# Patient Record
Sex: Female | Born: 2000 | Race: Black or African American | Hispanic: No | Marital: Single | Smoking: Never smoker
Health system: Southern US, Community
[De-identification: ages and names within clinical notes are randomized; demographics above are authoritative.]

---

## 2021-08-09 ENCOUNTER — Emergency Department (HOSPITAL_COMMUNITY): Payer: Managed Care, Other (non HMO)

## 2021-08-09 ENCOUNTER — Encounter (HOSPITAL_COMMUNITY): Payer: Self-pay | Admitting: Emergency Medicine

## 2021-08-09 ENCOUNTER — Emergency Department (HOSPITAL_COMMUNITY)
Admission: EM | Admit: 2021-08-09 | Discharge: 2021-08-09 | Disposition: A | Discharge status: Home / Self Care | Payer: Managed Care, Other (non HMO) | Attending: Emergency Medicine | Admitting: Emergency Medicine

## 2021-08-09 ENCOUNTER — Other Ambulatory Visit: Payer: Self-pay

## 2021-08-09 DIAGNOSIS — R109 Unspecified abdominal pain: Secondary | ICD-10-CM | POA: Diagnosis not present

## 2021-08-09 DIAGNOSIS — R112 Nausea with vomiting, unspecified: Secondary | ICD-10-CM

## 2021-08-09 DIAGNOSIS — E860 Dehydration: Secondary | ICD-10-CM | POA: Diagnosis not present

## 2021-08-09 LAB — CBC WITH DIFFERENTIAL/PLATELET
Abs Immature Granulocytes: 0.1 10*3/uL — ABNORMAL HIGH (ref 0.00–0.07)
Basophils Absolute: 0 10*3/uL (ref 0.0–0.1)
Basophils Relative: 0 %
Eosinophils Absolute: 0 10*3/uL (ref 0.0–0.5)
Eosinophils Relative: 0 %
HCT: 46.2 % — ABNORMAL HIGH (ref 36.0–46.0)
Hemoglobin: 15.3 g/dL — ABNORMAL HIGH (ref 12.0–15.0)
Immature Granulocytes: 1 %
Lymphocytes Relative: 5 %
Lymphs Abs: 0.7 10*3/uL (ref 0.7–4.0)
MCH: 28.7 pg (ref 26.0–34.0)
MCHC: 33.1 g/dL (ref 30.0–36.0)
MCV: 86.7 fL (ref 80.0–100.0)
Monocytes Absolute: 0.7 10*3/uL (ref 0.1–1.0)
Monocytes Relative: 5 %
Neutro Abs: 14.3 10*3/uL — ABNORMAL HIGH (ref 1.7–7.7)
Neutrophils Relative %: 89 %
Platelets: 268 10*3/uL (ref 150–400)
RBC: 5.33 MIL/uL — ABNORMAL HIGH (ref 3.87–5.11)
RDW: 13.7 % (ref 11.5–15.5)
WBC: 15.9 10*3/uL — ABNORMAL HIGH (ref 4.0–10.5)
nRBC: 0 % (ref 0.0–0.2)

## 2021-08-09 LAB — COMPREHENSIVE METABOLIC PANEL WITH GFR
ALT: 19 U/L (ref 0–44)
AST: 22 U/L (ref 15–41)
Albumin: 4.9 g/dL (ref 3.5–5.0)
Alkaline Phosphatase: 66 U/L (ref 38–126)
Anion gap: 11 (ref 5–15)
BUN: 14 mg/dL (ref 6–20)
CO2: 20 mmol/L — ABNORMAL LOW (ref 22–32)
Calcium: 10.3 mg/dL (ref 8.9–10.3)
Chloride: 105 mmol/L (ref 98–111)
Creatinine, Ser: 0.82 mg/dL (ref 0.44–1.00)
GFR, Estimated: >60 mL/min
Glucose, Bld: 150 mg/dL — ABNORMAL HIGH (ref 70–99)
Potassium: 3.8 mmol/L (ref 3.5–5.1)
Sodium: 136 mmol/L (ref 135–145)
Total Bilirubin: 1 mg/dL (ref 0.3–1.2)
Total Protein: 8.7 g/dL — ABNORMAL HIGH (ref 6.5–8.1)

## 2021-08-09 LAB — URINALYSIS, ROUTINE W REFLEX MICROSCOPIC
Bilirubin Urine: NEGATIVE
Glucose, UA: NEGATIVE mg/dL
Hgb urine dipstick: NEGATIVE
Ketones, ur: NEGATIVE mg/dL
Leukocytes,Ua: NEGATIVE
Nitrite: NEGATIVE
Protein, ur: NEGATIVE mg/dL
Specific Gravity, Urine: <1.005 — ABNORMAL LOW (ref 1.005–1.030)
pH: 5 (ref 5.0–8.0)

## 2021-08-09 LAB — RAPID URINE DRUG SCREEN, HOSP PERFORMED
Amphetamines: NOT DETECTED
Barbiturates: NOT DETECTED
Benzodiazepines: NOT DETECTED
Cocaine: NOT DETECTED
Opiates: POSITIVE — AB
Tetrahydrocannabinol: POSITIVE — AB

## 2021-08-09 LAB — I-STAT BETA HCG BLOOD, ED (MC, WL, AP ONLY): I-stat hCG, quantitative: <5 m[IU]/mL (ref <5)

## 2021-08-09 LAB — LIPASE, BLOOD: Lipase: 24 U/L (ref 11–51)

## 2021-08-09 LAB — ETHANOL: Alcohol, Ethyl (B): <10 mg/dL

## 2021-08-09 MED ORDER — ONDANSETRON 4 MG PO TBDP: 4.0000 mg | ORAL_TABLET | Freq: Three times a day (TID) | ORAL | 0 refills | Status: DC | PRN

## 2021-08-09 MED ORDER — SODIUM CHLORIDE 0.9 % IV BOLUS
1000.0000 mL | Freq: Once | INTRAVENOUS | Status: AC
  Administered 2021-08-09: 1000 mL via INTRAVENOUS

## 2021-08-09 MED ORDER — IOHEXOL 300 MG/ML  SOLN
100.0000 mL | Freq: Once | INTRAMUSCULAR | Status: AC | PRN
  Administered 2021-08-09: 100 mL via INTRAVENOUS

## 2021-08-09 MED ORDER — ONDANSETRON HCL 4 MG/2ML IJ SOLN
4.0000 mg | Freq: Once | INTRAMUSCULAR | Status: AC
  Administered 2021-08-09: 4 mg via INTRAVENOUS
  Filled 2021-08-09: qty 2

## 2021-08-09 MED ORDER — MORPHINE SULFATE (PF) 4 MG/ML IV SOLN
4.0000 mg | Freq: Once | INTRAVENOUS | Status: AC
Start: 2021-08-09 — End: 2021-08-09
  Administered 2021-08-09: 4 mg via INTRAVENOUS
  Filled 2021-08-09: qty 1

## 2021-08-09 NOTE — ED Triage Notes (Signed)
Patient states she had 3 shots last night, ate a whole bag of candy and 2 full meals but has been vomiting sine 9am this morning. Reports diarrhea.  ?

## 2021-08-09 NOTE — Discharge Instructions (Signed)
Your history, exam, work-up today reflected dehydration likely due to either a viral gastroenteritis versus gastric irritation from food or drink.  As you have had stability over the last 5 hours and your symptoms have improved with medications, we feel you are safe for discharge home.  Please rest and stay hydrated and follow-up with your primary doctor.  The CT scan did not show any evidence of appendicitis, bowel obstruction, or other concerning finding.  It did show the corpus luteum in the right ovary as we discussed likely related to ovulation.  Please follow-up with your OB/GYN.  We had a discussion together and agreed to hold on further exam and ultrasound given your lack of other pelvic symptoms.  If any symptoms change or worsen acutely, please return to the nearest emergency department. ?

## 2021-08-09 NOTE — ED Provider Notes (Signed)
She is [facility]-EMERGENCY DEPT Provider Note   CSN: [id number] Arrival date & time: 08/09/21  1647     History  Chief Complaint  Patient presents with   Emesis    Cynthia Mccall is a 21 y.o. female.   Emesis Severity:  Severe Duration:  8 hours Timing:  Constant Quality:  Stomach contents Progression:  Unchanged Chronicity:  New Recent urination:  Normal Relieved by:  Nothing Worsened by:  Nothing Associated symptoms: abdominal pain and diarrhea   Associated symptoms: no chills, no cough, no fever, no headaches, no myalgias and no URI   Risk factors: alcohol use   Risk factors: no diabetes       Home Medications Prior to Admission medications   Not on File      Allergies    Patient has no known allergies.    Review of Systems   Review of Systems  Constitutional:  Negative for chills, diaphoresis, fatigue and fever.  HENT:  Negative for congestion.   Respiratory:  Negative for cough, chest tightness, shortness of breath and wheezing.   Cardiovascular:  Negative for chest pain, palpitations and leg swelling.  Gastrointestinal:  Positive for abdominal pain, diarrhea, nausea and vomiting. Negative for abdominal distention and constipation.  Genitourinary:  Negative for dysuria, flank pain, frequency, pelvic pain, vaginal bleeding, vaginal discharge and vaginal pain.  Musculoskeletal:  Negative for back pain, myalgias, neck pain and neck stiffness.  Skin:  Negative for rash and wound.  Neurological:  Negative for weakness, light-headedness, numbness and headaches.  Psychiatric/Behavioral:  Negative for agitation and confusion.   All other systems reviewed and are negative.  Physical Exam Updated Vital Signs BP 111/79 (BP Location: Left Arm)    Pulse 88    Temp 98.7 F (37.1 C) (Oral)    Resp 18    Ht 5\' 9"  (1.753 m)    Wt 68 kg    LMP  (LMP Unknown)    SpO2 99%    BMI 22.15 kg/m  Physical Exam Vitals and nursing note reviewed.   Constitutional:      General: She is not in acute distress.    Appearance: She is well-developed. She is not ill-appearing, toxic-appearing or diaphoretic.  HENT:     Head: Normocephalic and atraumatic.     Nose: No congestion or rhinorrhea.     Mouth/Throat:     Mouth: Mucous membranes are dry.     Pharynx: No oropharyngeal exudate or posterior oropharyngeal erythema.  Eyes:     Extraocular Movements: Extraocular movements intact.     Conjunctiva/sclera: Conjunctivae normal.     Pupils: Pupils are equal, round, and reactive to light.  Cardiovascular:     Rate and Rhythm: Normal rate and regular rhythm.     Heart sounds: No murmur heard. Pulmonary:     Effort: Pulmonary effort is normal. No respiratory distress.     Breath sounds: Normal breath sounds. No wheezing, rhonchi or rales.  Chest:     Chest wall: No tenderness.  Abdominal:     General: Abdomen is flat.     Palpations: Abdomen is soft.     Tenderness: There is abdominal tenderness. There is no right CVA tenderness, left CVA tenderness, guarding or rebound.  Musculoskeletal:        General: No swelling or tenderness.     Cervical back: Neck supple. No tenderness.     Right lower leg: No edema.     Left lower leg: No edema.  Skin:    General: Skin is warm and dry.     Capillary Refill: Capillary refill takes less than 2 seconds.     Findings: No erythema or rash.  Neurological:     General: No focal deficit present.     Mental Status: She is alert.  Psychiatric:        Mood and Affect: Mood normal.    ED Results / Procedures / Treatments   Labs (all labs ordered are listed, but only abnormal results are displayed) Labs Reviewed  CBC WITH DIFFERENTIAL/PLATELET - Abnormal; Notable for the following components:      Result Value   WBC 15.9 (*)    RBC 5.33 (*)    Hemoglobin 15.3 (*)    HCT 46.2 (*)    Neutro Abs 14.3 (*)    Abs Immature Granulocytes 0.10 (*)    All other components within normal limits   COMPREHENSIVE METABOLIC PANEL - Abnormal; Notable for the following components:   CO2 20 (*)    Glucose, Bld 150 (*)    Total Protein 8.7 (*)    All other components within normal limits  URINALYSIS, ROUTINE W REFLEX MICROSCOPIC - Abnormal; Notable for the following components:   Specific Gravity, Urine <1.005 (*)    All other components within normal limits  RAPID URINE DRUG SCREEN, HOSP PERFORMED - Abnormal; Notable for the following components:   Opiates POSITIVE (*)    Tetrahydrocannabinol POSITIVE (*)    All other components within normal limits  URINE CULTURE  LIPASE, BLOOD  ETHANOL  I-STAT BETA HCG BLOOD, ED ([facility], [facility], [facility] ONLY)    EKG None  Radiology CT ABDOMEN PELVIS W CONTRAST  Result Date: 08/09/2021 CLINICAL DATA:  Abdominal pain, acute, nonlocalized EXAM: CT ABDOMEN AND PELVIS WITH CONTRAST TECHNIQUE: Multidetector CT imaging of the abdomen and pelvis was performed using the standard protocol following bolus administration of intravenous contrast. RADIATION DOSE REDUCTION: This exam was performed according to the departmental dose-optimization program which includes automated exposure control, adjustment of the mA and/or kV according to patient size and/or use of iterative reconstruction technique. CONTRAST:  100mL OMNIPAQUE IOHEXOL 300 MG/ML  SOLN COMPARISON:  None. FINDINGS: Lower chest: No acute abnormality. Hepatobiliary: No focal liver abnormality is seen. No gallstones, gallbladder wall thickening, or biliary dilatation. Pancreas: Unremarkable. No pancreatic ductal dilatation or surrounding inflammatory changes. Spleen: Normal in size without focal abnormality. Adrenals/Urinary Tract: Adrenal glands are unremarkable. Kidneys enhance symmetrically. No hydronephrosis. No obstructing nephrolithiasis. Bladder is decompressed. Stomach/Bowel: Stomach is within normal limits. Appendix appears normal. No evidence of bowel wall thickening, distention, or inflammatory changes.  Vascular/Lymphatic: No significant vascular findings are present. No enlarged abdominal or pelvic lymph nodes. Reproductive: Uterus and bilateral adnexa are unremarkable. There is a RIGHT-sided corpus luteum. Other: Trace pelvic free fluid, physiologic. Musculoskeletal: No acute or significant osseous findings. IMPRESSION: No suspicious CT etiology for acute abdominal pain identified. RIGHT-sided corpus luteum which could reflect a source of pain appropriate clinical setting. Electronically Signed   By: Meda KlinefelterStephanie  Peacock M.D.   On: 08/09/2021 18:43    Procedures Procedures    Medications Ordered in ED Medications  sodium chloride 0.9 % bolus 1,000 mL (0 mLs Intravenous Stopped 08/09/21 1832)  ondansetron (ZOFRAN) injection 4 mg (4 mg Intravenous Given 08/09/21 1723)  morphine (PF) 4 MG/ML injection 4 mg (4 mg Intravenous Given 08/09/21 1741)  iohexol (OMNIPAQUE) 300 MG/ML solution 100 mL (100 mLs Intravenous Contrast Given 08/09/21 1824)  sodium chloride 0.9 % bolus  1,000 mL (0 mLs Intravenous Stopped 08/09/21 2124)    ED Course/ Medical Decision Making/ A&P                           Medical Decision Making Amount and/or Complexity of Data Reviewed Labs: ordered. Radiology: ordered.  Risk Prescription drug management.    Cynthia Mccall is a 21 y.o. female with no significant past medical history who presents with nausea, vomiting, diarrhea, abdominal pain, and fatigue.  According to patient and friend, patient was doing well until this morning.  Patient reportedly does not drink alcohol and had 3 shots of vodka last night as well as eating [employer] and cookout.  The friend says that she had similar nausea, vomiting, and diarrhea today but not as severe as the patient.  Patient says that she is having abdominal cramping and aching and has not been able to control her vomiting.  She denies any constipation and denies any urinary symptoms.  Denies any vaginal bleeding or vaginal discharge.  Reports  she has not missed any menstrual cycle.  She denies any fevers, chills, congestion, cough, chest pain, shortness of breath.  Patient was agitated with all the vomiting.  There was no report of any bloody emesis or stools.  No reported drug use.  No other complaints reported  On exam, patient is rolling around the bed agitated with nausea and vomiting.  She reportedly had diarrhea into a trash can in triage.  On my exam, patient was complaining of discomfort wherever I was pushing on her abdomen but no focality initially.  Lungs were clear and chest was nontender.  Patient had dry mucous membranes and was dry heaving.  Clinically I suspect she has either food poisoning, symptoms from alcohol use and dehydration, or a gastroenteritis given the friend having similar symptoms.  We will get screening labs, give her fluids, nausea medicine, and will reassess.  If she is having any focality to her abdominal pain, we will discuss further work-up including possible imaging however initially I suspect this is more dehydration.  Anticipate reassessment after work-up.     5:38 PM Patient still writhing around screaming in pain.  We will give some pain medicine and get a CT scan given her continued tenderness now all of the abdomen.  7:55 PM CT scan returned showing no acute abnormality to cause symptoms.  Patient does have corpus luteum on the right which may explain some of discomfort.  I reassessed patient and her pain is improved.  We discussed pelvic exam and ultrasound but patient reports the pain has slightly improved and she does not think she has a pelvic problem at this time.  Also tachycardic so we will give more fluids as she is also dehydrated.  We will also start a p.o. challenge.  We will wait for her urinalysis to return but if work-up is reassuring and she is feeling better, dissipate discharge with nausea medicine for likely either food or alcohol causing gastritis with severe nausea, vomiting, and  abdominal pain.  After fluids and nausea medicine she is feeling much better.  Her labs also returned overall reassuring.  No evidence of UTI.  Patient will follow-up with PCP and is feeling better now.  She was given prescription for nausea medicine.  I suspect either a viral gastroenteritis versus symptoms related to food or drink intake.  Patient is in agreement with discharge home and PCP follow-up.  She will also follow-up with  OB/GYN for the corpus luteum seen on the right side.  She was not having further lower abdominal tenderness and we agreed for holding on further work-up there.  She no questions or concerns and was discharged in good condition.        Final Clinical Impression(s) / ED Diagnoses Final diagnoses:  Dehydration  Nausea vomiting and diarrhea    Rx / DC Orders ED Discharge Orders          Ordered    ondansetron (ZOFRAN-ODT) 4 MG disintegrating tablet  Every 8 hours PRN        08/09/21 2154            Clinical Impression: 1. Dehydration   2. Nausea vomiting and diarrhea     Disposition: Discharge  Condition: Good  I have discussed the results, Dx and Tx plan with the pt(& family if present). He/she/they expressed understanding and agree(s) with the plan. Discharge instructions discussed at great length. Strict return precautions discussed and pt &/or family have verbalized understanding of the instructions. No further questions at time of discharge.    New Prescriptions   ONDANSETRON (ZOFRAN-ODT) 4 MG DISINTEGRATING TABLET    Take 1 tablet (4 mg total) by mouth every 8 (eight) hours as needed for nausea or vomiting.    Follow Up: [facility] [address] [place] [address] [phone] Schedule an appointment as soon as possible for a visit    [facility]ENC[facility] DE[facility] [address] [id number] mc [place] [address] [phone]         Koven Belinsky, Canary Brim, MD 08/09/21 2202

## 2021-08-09 NOTE — ED Notes (Signed)
Went into triage room to take pt to room 25, Pt stated she had diarrhea in the trash can.   ?

## 2021-08-11 LAB — URINE CULTURE: Culture: <10000 — AB

## 2022-07-29 ENCOUNTER — Emergency Department (HOSPITAL_COMMUNITY)
Admission: EM | Admit: 2022-07-29 | Discharge: 2022-07-29 | Disposition: A | Discharge status: Home / Self Care | Payer: Managed Care, Other (non HMO) | Attending: Emergency Medicine | Admitting: Emergency Medicine

## 2022-07-29 ENCOUNTER — Encounter (HOSPITAL_COMMUNITY): Payer: Self-pay | Admitting: Emergency Medicine

## 2022-07-29 ENCOUNTER — Other Ambulatory Visit: Payer: Self-pay

## 2022-07-29 ENCOUNTER — Emergency Department (HOSPITAL_COMMUNITY): Payer: Managed Care, Other (non HMO)

## 2022-07-29 DIAGNOSIS — M791 Myalgia, unspecified site: Secondary | ICD-10-CM | POA: Diagnosis not present

## 2022-07-29 DIAGNOSIS — D72829 Elevated white blood cell count, unspecified: Secondary | ICD-10-CM | POA: Diagnosis not present

## 2022-07-29 DIAGNOSIS — R1084 Generalized abdominal pain: Secondary | ICD-10-CM | POA: Insufficient documentation

## 2022-07-29 DIAGNOSIS — R112 Nausea with vomiting, unspecified: Secondary | ICD-10-CM | POA: Diagnosis not present

## 2022-07-29 DIAGNOSIS — R197 Diarrhea, unspecified: Secondary | ICD-10-CM | POA: Insufficient documentation

## 2022-07-29 DIAGNOSIS — Z1152 Encounter for screening for COVID-19: Secondary | ICD-10-CM | POA: Insufficient documentation

## 2022-07-29 LAB — CBC WITH DIFFERENTIAL/PLATELET
Abs Immature Granulocytes: 0.05 10*3/uL (ref 0.00–0.07)
Basophils Absolute: 0 10*3/uL (ref 0.0–0.1)
Basophils Relative: 0 %
Eosinophils Absolute: 0 10*3/uL (ref 0.0–0.5)
Eosinophils Relative: 0 %
HCT: 43.3 % (ref 36.0–46.0)
Hemoglobin: 14.2 g/dL (ref 12.0–15.0)
Immature Granulocytes: 0 %
Lymphocytes Relative: 4 %
Lymphs Abs: 0.5 10*3/uL — ABNORMAL LOW (ref 0.7–4.0)
MCH: 28.5 pg (ref 26.0–34.0)
MCHC: 32.8 g/dL (ref 30.0–36.0)
MCV: 86.8 fL (ref 80.0–100.0)
Monocytes Absolute: 0.4 10*3/uL (ref 0.1–1.0)
Monocytes Relative: 4 %
Neutro Abs: 11.7 10*3/uL — ABNORMAL HIGH (ref 1.7–7.7)
Neutrophils Relative %: 92 %
Platelets: 213 10*3/uL (ref 150–400)
RBC: 4.99 MIL/uL (ref 3.87–5.11)
RDW: 14.3 % (ref 11.5–15.5)
WBC: 12.7 10*3/uL — ABNORMAL HIGH (ref 4.0–10.5)
nRBC: 0 % (ref 0.0–0.2)

## 2022-07-29 LAB — COMPREHENSIVE METABOLIC PANEL
ALT: 20 U/L (ref 0–44)
AST: 26 U/L (ref 15–41)
Albumin: 4.6 g/dL (ref 3.5–5.0)
Alkaline Phosphatase: 53 U/L (ref 38–126)
Anion gap: 13 (ref 5–15)
BUN: 18 mg/dL (ref 6–20)
CO2: 20 mmol/L — ABNORMAL LOW (ref 22–32)
Calcium: 9.5 mg/dL (ref 8.9–10.3)
Chloride: 106 mmol/L (ref 98–111)
Creatinine, Ser: 0.83 mg/dL (ref 0.44–1.00)
GFR, Estimated: >60 mL/min (ref >60)
Glucose, Bld: 134 mg/dL — ABNORMAL HIGH (ref 70–99)
Potassium: 3.5 mmol/L (ref 3.5–5.1)
Sodium: 139 mmol/L (ref 135–145)
Total Bilirubin: 0.9 mg/dL (ref 0.3–1.2)
Total Protein: 7.8 g/dL (ref 6.5–8.1)

## 2022-07-29 LAB — ETHANOL: Alcohol, Ethyl (B): <10 mg/dL (ref <10)

## 2022-07-29 LAB — RESP PANEL BY RT-PCR (RSV, FLU A&B, COVID)  RVPGX2
Influenza A by PCR: NEGATIVE
Influenza B by PCR: NEGATIVE
Resp Syncytial Virus by PCR: NEGATIVE
SARS Coronavirus 2 by RT PCR: NEGATIVE

## 2022-07-29 LAB — LIPASE, BLOOD: Lipase: 33 U/L (ref 11–51)

## 2022-07-29 LAB — HCG, QUANTITATIVE, PREGNANCY: hCG, Beta Chain, Quant, S: <1 m[IU]/mL (ref <5)

## 2022-07-29 MED ORDER — IOHEXOL 300 MG/ML  SOLN
100.0000 mL | Freq: Once | INTRAMUSCULAR | Status: AC | PRN
  Administered 2022-07-29: 100 mL via INTRAVENOUS

## 2022-07-29 MED ORDER — DROPERIDOL 2.5 MG/ML IJ SOLN
1.2500 mg | Freq: Once | INTRAMUSCULAR | Status: AC
  Administered 2022-07-29: 1.25 mg via INTRAVENOUS
  Filled 2022-07-29: qty 2

## 2022-07-29 MED ORDER — ONDANSETRON HCL 4 MG PO TABS: 4.0000 mg | ORAL_TABLET | Freq: Three times a day (TID) | ORAL | 0 refills | Status: AC | PRN

## 2022-07-29 MED ORDER — DICYCLOMINE HCL 20 MG PO TABS: 20.0000 mg | ORAL_TABLET | Freq: Two times a day (BID) | ORAL | 0 refills | Status: AC | PRN

## 2022-07-29 MED ORDER — LACTATED RINGERS IV BOLUS
1000.0000 mL | Freq: Once | INTRAVENOUS | Status: AC
  Administered 2022-07-29: 1000 mL via INTRAVENOUS

## 2022-07-29 MED ORDER — KETOROLAC TROMETHAMINE 30 MG/ML IJ SOLN
30.0000 mg | Freq: Once | INTRAMUSCULAR | Status: AC
Start: 2022-07-29 — End: 2022-07-29
  Administered 2022-07-29: 30 mg via INTRAVENOUS
  Filled 2022-07-29: qty 1

## 2022-07-29 MED ORDER — DIPHENHYDRAMINE HCL 50 MG/ML IJ SOLN
25.0000 mg | Freq: Once | INTRAMUSCULAR | Status: AC
  Administered 2022-07-29: 25 mg via INTRAVENOUS
  Filled 2022-07-29: qty 1

## 2022-07-29 NOTE — Discharge Instructions (Addendum)
You have been seen in the Emergency Department (ED)  today for nausea and vomiting.  Your work up today has not shown a clear cause for your symptoms, but they may be due to a viral infection or food poisoning. You have been prescribed Zofran; please use as prescribed as needed for your nausea.  You can take Tylenol and ibuprofen as needed for pain as well as the Bentyl prescribed.  Follow up with your doctor as soon as possible, ideally within one week, regarding today's emergent visit and your symptoms of nausea/vomiting.   Return to the Emergency Department (ED)  if you develop severe abdominal pain, bloody vomiting, bloody diarrhea, if you are unable to tolerate fluids due to vomiting, or if you develop other symptoms that concern you.

## 2022-07-29 NOTE — ED Provider Notes (Signed)
[facility] Provider Note   CSN: [id number] Arrival date & time: 07/29/22  1038     History  Chief Complaint  Patient presents with   Emesis    Cynthia Mccall is a 22 y.o. female.  With no significant past medical history who presents with generalized abdominal pain associated with nausea, vomiting and diarrhea with bodyaches.  Started having nausea last night followed by abdominal discomfort diffusely and cramping associated with nonbloody nonbilious emesis multiple episodes of nonbloody diarrhea.  No fevers or chills, no chest pain or shortness of breath.  No history of abdominal surgery.  Currently on her menstrual period.  No dysuria, hematuria or vaginal discharge burning or itching or other complaints.  Had 1 margarita with dinner last night.  Last marijuana usage 3 days prior.   Emesis      Home Medications Prior to Admission medications   Medication Sig Start Date End Date Taking? Authorizing Provider  dicyclomine (BENTYL) 20 MG tablet Take 1 tablet (20 mg total) by mouth 2 (two) times daily as needed (cramping abdominal pain). 07/29/22  Yes Elgie Congo, MD  ondansetron (ZOFRAN) 4 MG tablet Take 1 tablet (4 mg total) by mouth every 8 (eight) hours as needed for nausea or vomiting. 07/29/22  Yes Elgie Congo, MD  albuterol (VENTOLIN HFA) 108 (90 Base) MCG/ACT inhaler 2 puffs every 6 (six) hours as needed for wheezing or shortness of breath.    [provider]  dicyclomine (BENTYL) 20 MG tablet Take 20 mg by mouth daily as needed for spasms.    [provider]  ibuprofen (ADVIL) 200 MG tablet Take 400-600 mg by mouth every 6 (six) hours as needed for moderate pain.    [provider]      Allergies    Patient has no known allergies.    Review of Systems   Review of Systems  Gastrointestinal:  Positive for vomiting.    Physical Exam Updated Vital Signs BP (!) 108/90   Pulse 98    Temp 97.8 F (36.6 C) (Oral)   Resp 18   Ht 5' 9"$  (1.753 m)   Wt 68 kg   LMP 07/28/2022   SpO2 100%   BMI 22.14 kg/m  Physical Exam Constitutional: Alert and oriented.  Uncomfortable and retching but no active emesis Eyes: Conjunctivae are normal. ENT      Head: Normocephalic and atraumatic.      Nose: No congestion.      Mouth/Throat: Mucous membranes are moist.      Neck: No stridor. Cardiovascular: S1, S2, borderline tachycardic, regular rhythm Respiratory: Normal respiratory effort. Breath sounds are normal.  O2 sat 100 on RA Gastrointestinal: Soft and nondistended with diffuse tenderness no localization, no rebound or guarding Musculoskeletal: Normal range of motion in all extremities. Neurologic: Normal speech and language. No gross focal neurologic deficits are appreciated. Skin: Skin is warm, dry and intact. No rash noted. Psychiatric: Mood and affect are normal. Speech and behavior are normal.  ED Results / Procedures / Treatments   Labs (all labs ordered are listed, but only abnormal results are displayed) Labs Reviewed  COMPREHENSIVE METABOLIC PANEL - Abnormal; Notable for the following components:      Result Value   CO2 20 (*)    Glucose, Bld 134 (*)    All other components within normal limits  CBC WITH DIFFERENTIAL/PLATELET - Abnormal; Notable for the following components:   WBC 12.7 (*)  Neutro Abs 11.7 (*)    Lymphs Abs 0.5 (*)    All other components within normal limits  RESP PANEL BY RT-PCR (RSV, FLU A&B, COVID)  RVPGX2  LIPASE, BLOOD  ETHANOL  HCG, QUANTITATIVE, PREGNANCY  URINALYSIS, ROUTINE W REFLEX MICROSCOPIC    EKG None  Radiology CT ABDOMEN PELVIS W CONTRAST  Result Date: 07/29/2022 CLINICAL DATA:  evaluation of diffuse abdominal pain, nausea, vomiting diarrhea with body aches. EXAM: CT ABDOMEN AND PELVIS WITH CONTRAST TECHNIQUE: Multidetector CT imaging of the abdomen and pelvis was performed using the standard protocol following bolus  administration of intravenous contrast. RADIATION DOSE REDUCTION: This exam was performed according to the departmental dose-optimization program which includes automated exposure control, adjustment of the mA and/or kV according to patient size and/or use of iterative reconstruction technique. CONTRAST:  151m OMNIPAQUE IOHEXOL 300 MG/ML  SOLN COMPARISON:  August 09, 2021. FINDINGS: Lower chest: Lung bases are clear. No effusion. No consolidative changes. Hepatobiliary: No focal, suspicious hepatic lesion. No pericholecystic stranding. No biliary duct dilation. Portal vein is patent. Pancreas: Normal, without mass, inflammation or ductal dilatation. Spleen: Normal. Adrenals/Urinary Tract: Adrenal glands are unremarkable. Symmetric renal enhancement. No sign of hydronephrosis. No suspicious renal lesion or perinephric stranding. Urinary bladder is grossly unremarkable. , Ureters are difficult to assess due to paucity of intra-abdominal and retroperitoneal fat. Stomach/Bowel: No stranding adjacent to the stomach. Stomach under distended limiting assessment. Individual bowel loops difficult to assess due to the lack of intra-abdominal and mesenteric fat. No gross perienteric stranding or bowel wall thickening. No signs of bowel obstruction. Scattered fluid-filled bowel loops without substantial distension present throughout the abdomen. Fluid extends into the nondilated ascending colon. Normal appendix. Vascular/Lymphatic: Aorta with smooth contours. IVC with smooth contours. No aneurysmal dilation of the abdominal aorta. There is no gastrohepatic or hepatoduodenal ligament lymphadenopathy. No retroperitoneal or mesenteric lymphadenopathy. No pelvic sidewall lymphadenopathy. Reproductive: Unremarkable by CT to the extent evaluated. Other: No pneumoperitoneum.  No ascites. Musculoskeletal: No acute or significant osseous findings. IMPRESSION: 1. Scattered fluid-filled bowel loops without substantial distension present  throughout the abdomen. Fluid extends into the nondilated ascending colon. Findings are nonspecific but could be seen in the setting of gastroenteritis. Electronically Signed   By: GZetta BillsM.D.   On: 07/29/2022 13:38    Procedures Procedures    Medications Ordered in ED Medications  lactated ringers bolus 1,000 mL (1,000 mLs Intravenous New Bag/Given 07/29/22 1137)  droperidol (INAPSINE) 2.5 MG/ML injection 1.25 mg (1.25 mg Intravenous Given 07/29/22 1138)  diphenhydrAMINE (BENADRYL) injection 25 mg (25 mg Intravenous Given 07/29/22 1138)  ketorolac (TORADOL) 30 MG/ML injection 30 mg (30 mg Intravenous Given 07/29/22 1139)  iohexol (OMNIPAQUE) 300 MG/ML solution 100 mL (100 mLs Intravenous Contrast Given 07/29/22 1313)    ED Course/ Medical Decision Making/ A&P Clinical Course as of 07/29/22 1441  Wed Jul 29, 2022  1257 Labs reviewed by me, mild leukocytosis 12.7 with left shift likely reactive from vomiting.  No transaminitis and normal lipase, doubt acute pancreatitis or biliary pathology.  Alcohol undetected.  RVP negative.  Patient not pregnant. [VB]  [id number] patient, she is feeling significantly improved since medications.  She has had no further nausea or vomiting.  She is tolerating p.o. pain has improved.  Discharging patient with strict return precautions, as needed Bentyl and Zofran and follow-up with PCP.  She is in agreement with plan and safe for discharge at this time. [VB]    Clinical Course User Index [VB] BNechama Guard  Valda Lamb, MD   {                            Medical Decision Making Ethelene Steketee is a 22 y.o. female.  With no significant past medical history who presents with generalized abdominal pain associated with nausea, vomiting and diarrhea with bodyaches.  Patient's presentation seems most consistent with either acute food poisoning versus viral gastroenteritis versus cannabinoid hyperemesis among multiple other etiologies.  Labs reviewed by me,  mild leukocytosis 12.7 with left shift likely reactive from vomiting.  No transaminitis and normal lipase, doubt acute pancreatitis or biliary pathology.  Alcohol undetected.  RVP negative.  Patient not pregnant. [VB]  CTAP with IV contrast obtained, reviewed by me, no evidence of bowel obstruction.  Read by radiology as no acute findings other than scattered fluid-filled bowel loops with no distention associated with gastroenteritis.   Reassessed patient, she is feeling significantly improved since medications.  She has had no further nausea or vomiting.  She is tolerating p.o. pain has improved.  Discharging patient with strict return precautions, as needed Bentyl and Zofran and follow-up with PCP.  She is in agreement with plan and safe for discharge at this time. [VB]   Amount and/or Complexity of Data Reviewed Labs: ordered. Radiology: ordered.  Risk Prescription drug management.    Final Clinical Impression(s) / ED Diagnoses Final diagnoses:  Generalized abdominal pain  Nausea vomiting and diarrhea    Rx / DC Orders ED Discharge Orders          Ordered    ondansetron (ZOFRAN) 4 MG tablet  Every 8 hours PRN        07/29/22 1434    dicyclomine (BENTYL) 20 MG tablet  2 times daily PRN        07/29/22 1434              Elgie Congo, MD 07/29/22 1441

## 2022-07-29 NOTE — ED Notes (Signed)
PO Challenge for pt: pt given crackers, gingerale and ice. Pt tolerated well

## 2022-07-29 NOTE — ED Triage Notes (Signed)
N/v/d bodyaches, generalized cramping, headache, generalized abd pain x12 hours.  Hx of ETOH use with last drink yesterday and marijuana usage 3 days ago.

## 2022-07-29 NOTE — ED Notes (Signed)
Pt ambulated to bathroom stated she needs to have diarrhea

## 2022-07-29 NOTE — ED Notes (Signed)
Patient transported to CT 

## 2022-07-29 NOTE — ED Notes (Signed)
Dc instructions and scripts reviewed with pt no questions or concerns at this time.

## 2024-02-14 IMAGING — CT CT ABD-PELV W/ CM
2 of 4 series · 16 of 46 positions shown, 18 images · IV contrast (OMNIPAQUE 300)
Comparison: None.

CLINICAL DATA: Abdominal pain, acute, nonlocalized

EXAM:
CT ABDOMEN AND PELVIS WITH CONTRAST
TECHNIQUE: Multidetector CT imaging of the abdomen and pelvis was performed
using the standard protocol following bolus administration of
intravenous contrast.

[Series 2: axial st · axial · 0.68mm/px · z∈[+1025,+1420]mm · 13 of 89 slices shown, 15 images]
[im 5/89  soft-tissue]
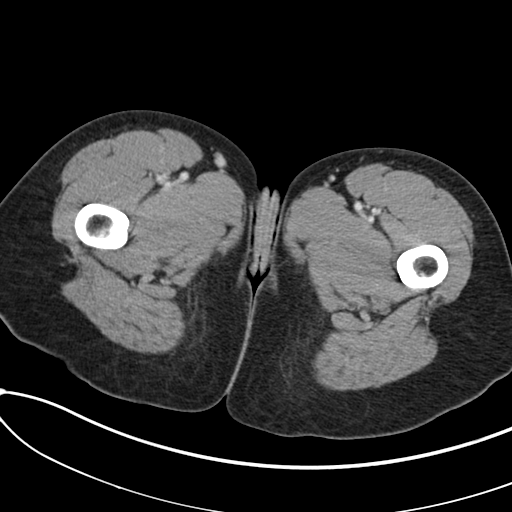
[im 5/89  bone]
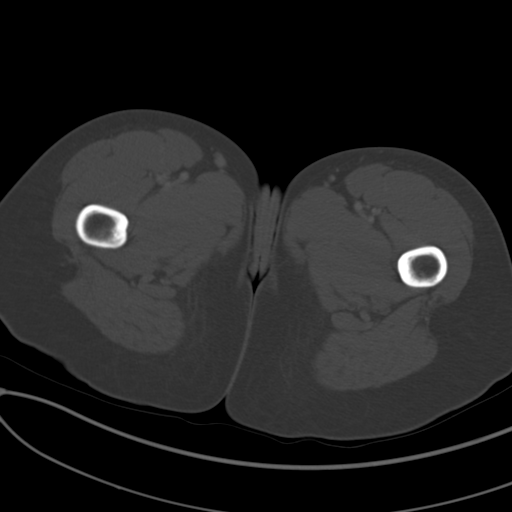
[im 13/89  soft-tissue]
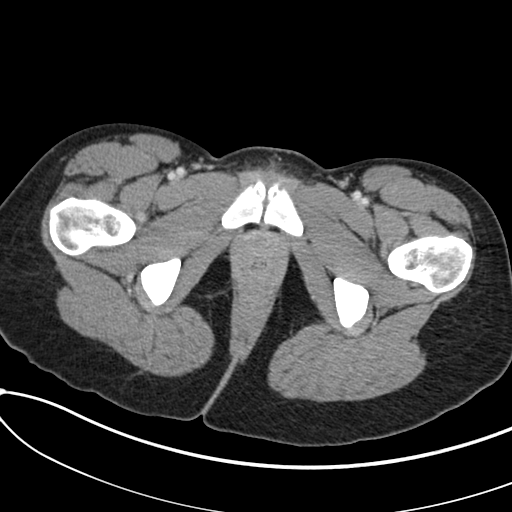
[im 17/89  soft-tissue]
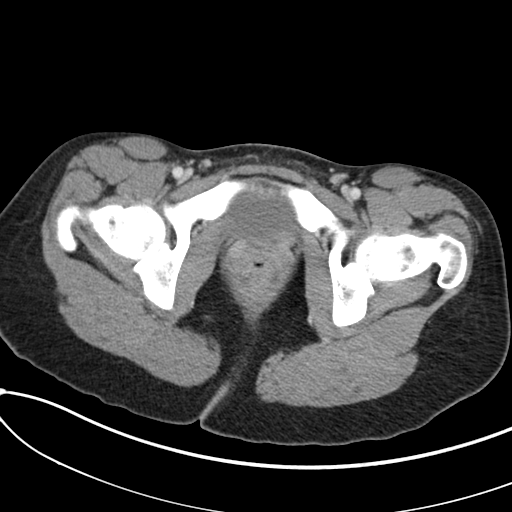
[im 26/89  soft-tissue]
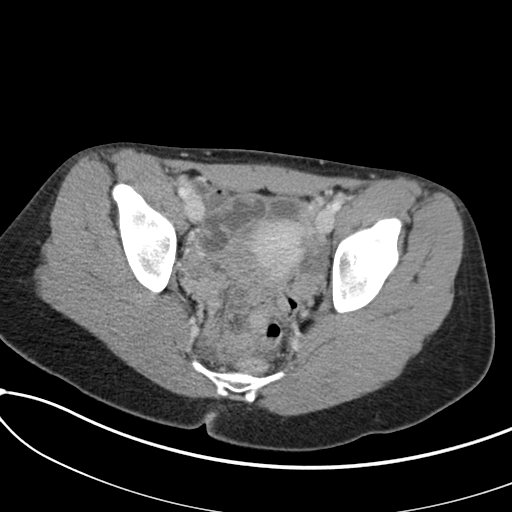
[im 30/89  soft-tissue]
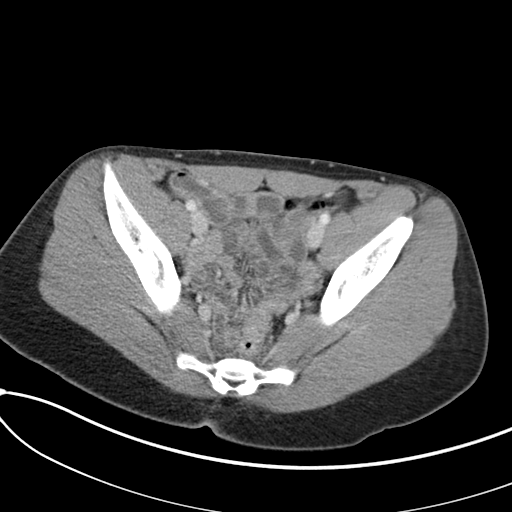
[im 38/89  soft-tissue]
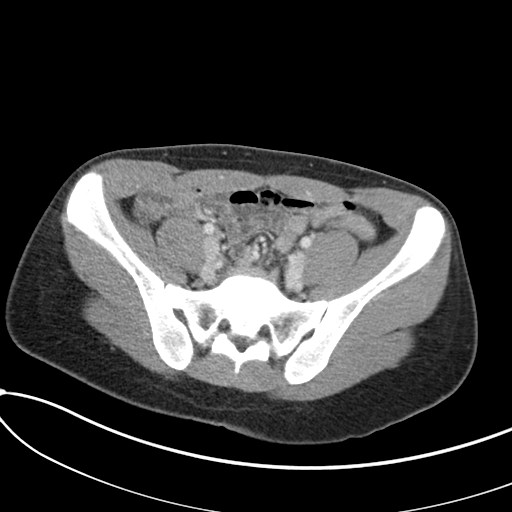
[im 47/89  soft-tissue]
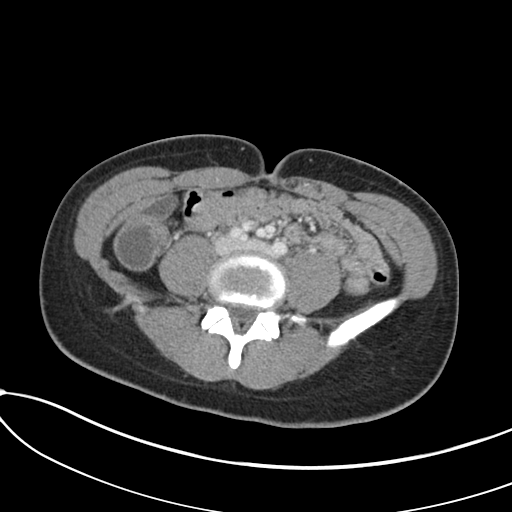
[im 51/89  soft-tissue]
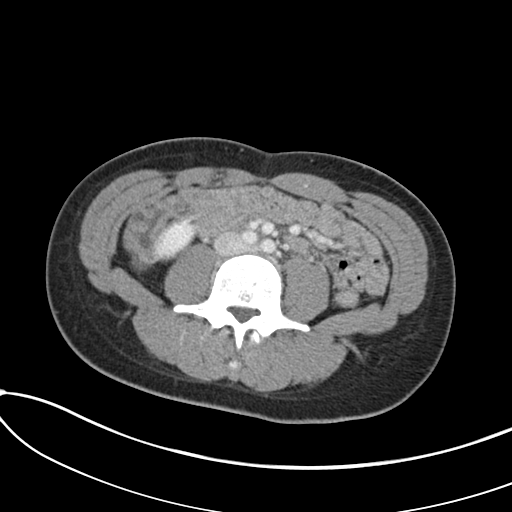
[im 59/89  soft-tissue]
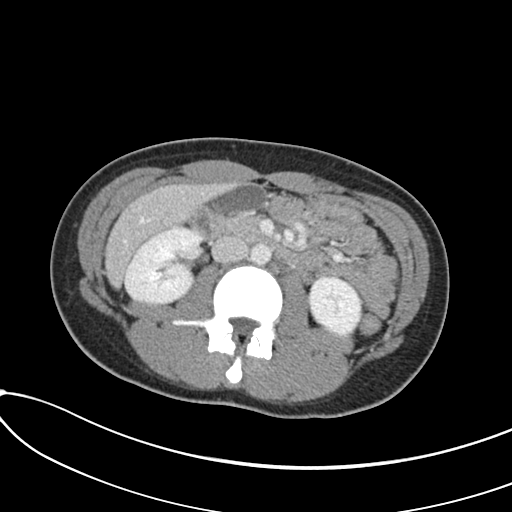
[im 59/89  bone]
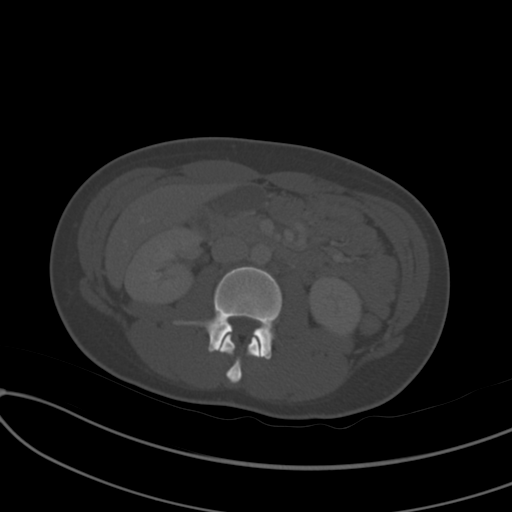
[im 63/89  soft-tissue]
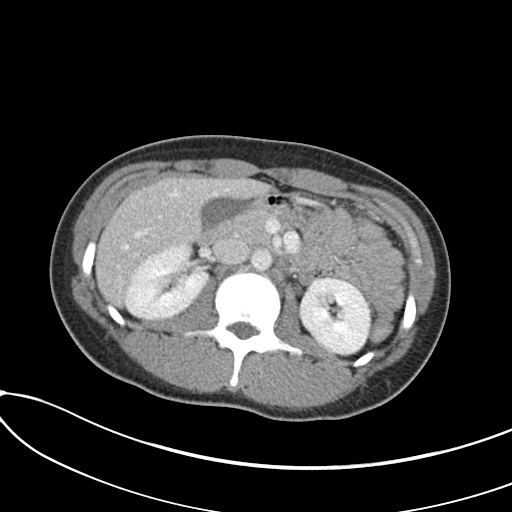
[im 72/89  soft-tissue]
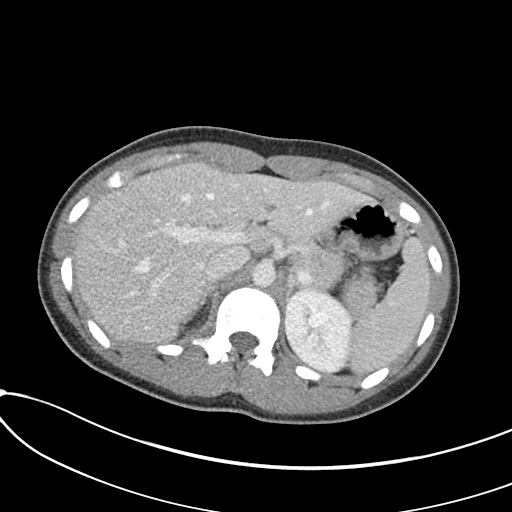
[im 76/89  soft-tissue]
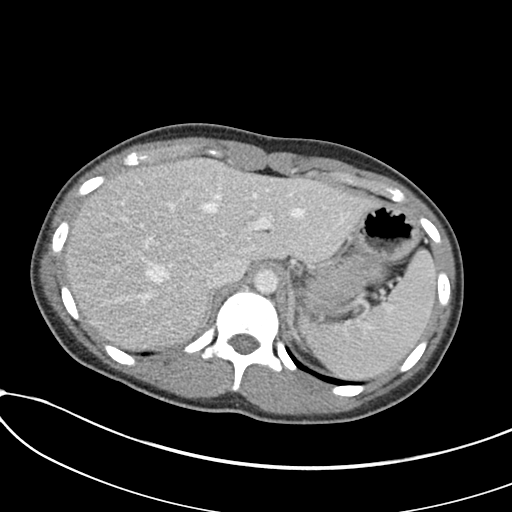
[im 84/89  soft-tissue]
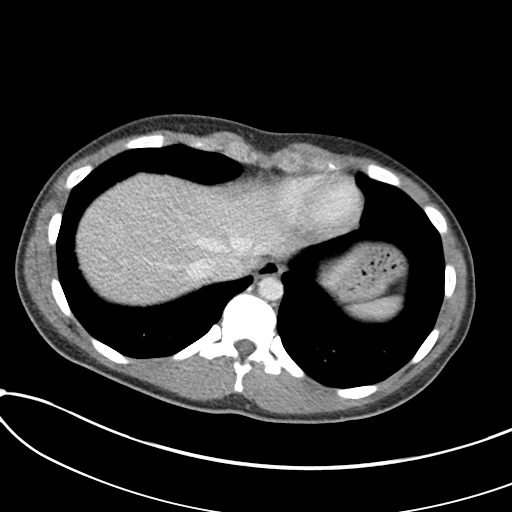

[Series 4: coronal st · coronal · 0.71mm/px · 3 of 114 slices shown]
[im 38/114  soft-tissue]
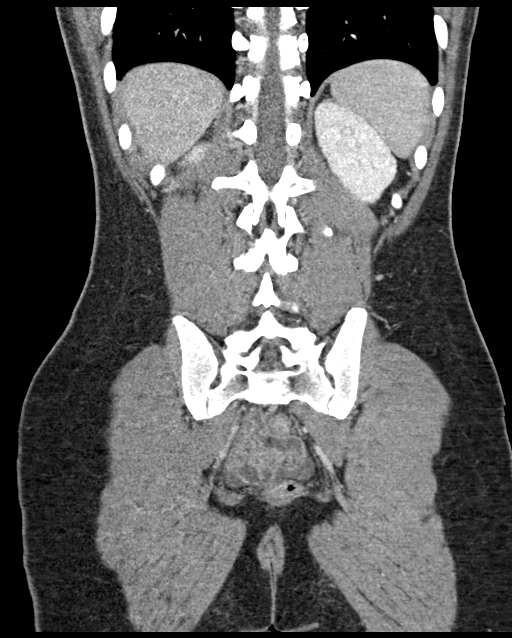
[im 51/114  soft-tissue]
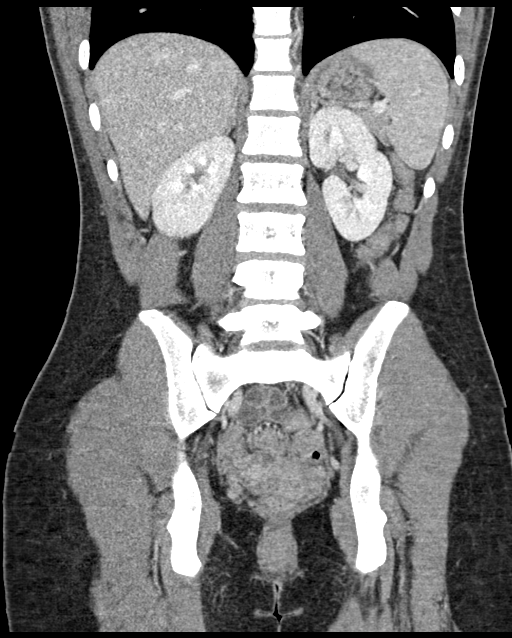
[im 63/114  soft-tissue]
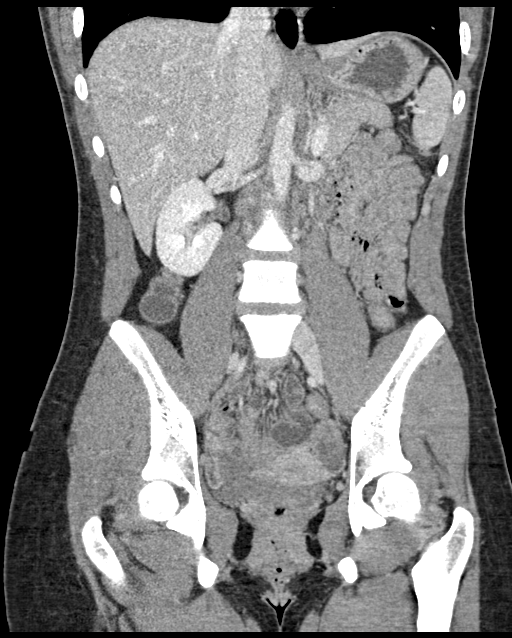

[16 of 46 positions shown; findings below may reference images not displayed]

RADIATION DOSE REDUCTION: This exam was performed according to the
departmental dose-optimization program which includes automated
exposure control, adjustment of the mA and/or kV according to
patient size and/or use of iterative reconstruction technique.

CONTRAST:  100mL OMNIPAQUE IOHEXOL 300 MG/ML  SOLN
FINDINGS: Lower chest: No acute abnormality.

Hepatobiliary: No focal liver abnormality is seen. No gallstones,
gallbladder wall thickening, or biliary dilatation.

Pancreas: Unremarkable. No pancreatic ductal dilatation or
surrounding inflammatory changes.

Spleen: Normal in size without focal abnormality.

Adrenals/Urinary Tract: Adrenal glands are unremarkable. Kidneys
enhance symmetrically. No hydronephrosis. No obstructing
nephrolithiasis. Bladder is decompressed.

Stomach/Bowel: Stomach is within normal limits. Appendix appears
normal. No evidence of bowel wall thickening, distention, or
inflammatory changes.

Vascular/Lymphatic: No significant vascular findings are present. No
enlarged abdominal or pelvic lymph nodes.

Reproductive: Uterus and bilateral adnexa are unremarkable. There is
a RIGHT-sided corpus luteum.

Other: Trace pelvic free fluid, physiologic.

Musculoskeletal: No acute or significant osseous findings.
IMPRESSION: No suspicious CT etiology for acute abdominal pain identified.
RIGHT-sided corpus luteum which could reflect a source of pain
appropriate clinical setting.
